# Patient Record
Sex: Male | Born: 2000 | Race: White | Hispanic: No | Marital: Single | Smoking: Never smoker
Health system: Southern US, Community
[De-identification: ages and names within clinical notes are randomized; demographics above are authoritative.]

## PROBLEM LIST (undated history)

## (undated) DIAGNOSIS — E785 Hyperlipidemia, unspecified: Secondary | ICD-10-CM

## (undated) HISTORY — PX: WISDOM TOOTH EXTRACTION: SHX21

## (undated) HISTORY — DX: Hyperlipidemia, unspecified: E78.5

---
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
## 2022-05-12 ENCOUNTER — Other Ambulatory Visit
Admission: RE | Admit: 2022-05-12 | Discharge: 2022-05-12 | Disposition: A | Discharge status: Home / Self Care | Payer: BC Managed Care – PPO | Source: Ambulatory Visit | Attending: Pediatrics | Admitting: Pediatrics

## 2022-05-12 DIAGNOSIS — Z13220 Encounter for screening for lipoid disorders: Secondary | ICD-10-CM | POA: Insufficient documentation

## 2022-05-12 DIAGNOSIS — Z113 Encounter for screening for infections with a predominantly sexual mode of transmission: Secondary | ICD-10-CM | POA: Diagnosis not present

## 2022-05-12 DIAGNOSIS — E559 Vitamin D deficiency, unspecified: Secondary | ICD-10-CM | POA: Insufficient documentation

## 2022-05-12 DIAGNOSIS — Z713 Dietary counseling and surveillance: Secondary | ICD-10-CM | POA: Insufficient documentation

## 2022-05-12 LAB — LIPID PANEL
Cholesterol: 365 mg/dL — ABNORMAL HIGH (ref 0–200)
HDL: 61 mg/dL (ref >40)
LDL Cholesterol: 292 mg/dL — ABNORMAL HIGH (ref 0–99)
Total CHOL/HDL Ratio: 6 RATIO
Triglycerides: 62 mg/dL (ref <150)
VLDL: 12 mg/dL (ref 0–40)

## 2022-05-12 LAB — COMPREHENSIVE METABOLIC PANEL
ALT: 28 U/L (ref 0–44)
AST: 23 U/L (ref 15–41)
Albumin: 5.3 g/dL — ABNORMAL HIGH (ref 3.5–5.0)
Alkaline Phosphatase: 63 U/L (ref 38–126)
Anion gap: 8 (ref 5–15)
BUN: 16 mg/dL (ref 6–20)
CO2: 25 mmol/L (ref 22–32)
Calcium: 10.2 mg/dL (ref 8.9–10.3)
Chloride: 106 mmol/L (ref 98–111)
Creatinine, Ser: 0.92 mg/dL (ref 0.61–1.24)
GFR, Estimated: >60 mL/min (ref >60)
Glucose, Bld: 88 mg/dL (ref 70–99)
Potassium: 3.7 mmol/L (ref 3.5–5.1)
Sodium: 139 mmol/L (ref 135–145)
Total Bilirubin: 3.6 mg/dL — ABNORMAL HIGH (ref 0.3–1.2)
Total Protein: 8.6 g/dL — ABNORMAL HIGH (ref 6.5–8.1)

## 2022-05-12 LAB — HIV ANTIBODY (ROUTINE TESTING W REFLEX): HIV Screen 4th Generation wRfx: NONREACTIVE

## 2022-05-12 LAB — VITAMIN D 25 HYDROXY (VIT D DEFICIENCY, FRACTURES): Vit D, 25-Hydroxy: 29.91 ng/mL — ABNORMAL LOW (ref 30–100)

## 2022-05-13 LAB — RPR: RPR Ser Ql: NONREACTIVE

## 2023-01-15 ENCOUNTER — Encounter: Payer: Self-pay | Admitting: Emergency Medicine

## 2023-01-15 ENCOUNTER — Ambulatory Visit
Admission: EM | Admit: 2023-01-15 | Discharge: 2023-01-15 | Disposition: A | Discharge status: Home / Self Care | Payer: BC Managed Care – PPO | Attending: Physician Assistant | Admitting: Physician Assistant

## 2023-01-15 DIAGNOSIS — J029 Acute pharyngitis, unspecified: Secondary | ICD-10-CM

## 2023-01-15 DIAGNOSIS — J019 Acute sinusitis, unspecified: Secondary | ICD-10-CM | POA: Diagnosis not present

## 2023-01-15 DIAGNOSIS — J039 Acute tonsillitis, unspecified: Secondary | ICD-10-CM | POA: Diagnosis not present

## 2023-01-15 MED ORDER — IPRATROPIUM BROMIDE 0.06 % NA SOLN: 2.0000 | Freq: Four times a day (QID) | NASAL | 0 refills | Status: DC

## 2023-01-15 MED ORDER — AMOXICILLIN-POT CLAVULANATE 875-125 MG PO TABS: 1.0000 | ORAL_TABLET | Freq: Two times a day (BID) | ORAL | 0 refills | Status: AC

## 2023-01-15 NOTE — ED Provider Notes (Incomplete)
[facility]    CSN: [id number] Arrival date & time: 01/15/23  1929      History   Chief Complaint No chief complaint on file.   HPI Scott Beasley is a 22 y.o. male.   HPI  No past medical history on file.  There are no problems to display for this patient.   *** The histories are not reviewed yet. Please review them in the "History" navigator section and refresh this SmartLink.     Home Medications    Prior to Admission medications   Not on File    Family History No family history on file.  Social History     Allergies   Patient has no allergy information on record.   Review of Systems Review of Systems  Constitutional:  Negative for fatigue and fever.  HENT:  Positive for congestion, postnasal drip and rhinorrhea. Negative for sinus pressure, sinus pain and sore throat.   Respiratory:  Negative for cough and shortness of breath.   Gastrointestinal:  Negative for abdominal pain, diarrhea, nausea and vomiting.  Musculoskeletal:  Positive for myalgias.  Neurological:  Negative for weakness, light-headedness and headaches.  Hematological:  Negative for adenopathy.     Physical Exam Triage Vital Signs ED Triage Vitals  Enc Vitals Group     BP      Pulse      Resp      Temp      Temp src      SpO2      Weight      Height      Head Circumference      Peak Flow      Pain Score      Pain Loc      Pain Edu?      Excl. in GC?    No data found.  Updated Vital Signs There were no vitals taken for this visit.   Physical Exam Vitals and nursing note reviewed.  Constitutional:      General: He is not in acute distress.    Appearance: Normal appearance. He is well-developed. He is not ill-appearing.  HENT:     Head: Normocephalic and atraumatic.     Nose: Congestion present.     Mouth/Throat:     Mouth: Mucous membranes are moist.     Pharynx: Oropharynx is clear. Posterior oropharyngeal erythema present.  Eyes:     General:  No scleral icterus.    Conjunctiva/sclera: Conjunctivae normal.  Cardiovascular:     Rate and Rhythm: Normal rate and regular rhythm.     Heart sounds: Normal heart sounds.  Pulmonary:     Effort: Pulmonary effort is normal. No respiratory distress.     Breath sounds: Normal breath sounds.  Musculoskeletal:     Cervical back: Neck supple.  Skin:    General: Skin is warm and dry.     Capillary Refill: Capillary refill takes less than 2 seconds.  Neurological:     General: No focal deficit present.     Mental Status: He is alert. Mental status is at baseline.     Motor: No weakness.     Gait: Gait normal.  Psychiatric:        Mood and Affect: Mood normal.        Behavior: Behavior normal.      UC Treatments / Results  Labs (all labs ordered are listed, but only abnormal results are displayed) Labs Reviewed - No data to display  EKG   Radiology No results found.  Procedures Procedures (including critical care time)  Medications Ordered in UC Medications - No data to display  Initial Impression / Assessment and Plan / UC Course  I have reviewed the triage vital signs and the nursing notes.  Pertinent labs & imaging results that were available during my care of the patient were reviewed by me and considered in my medical decision making (see chart for details).     *** Final Clinical Impressions(s) / UC Diagnoses   Final diagnoses:  None   Discharge Instructions   None    ED Prescriptions   None    PDMP not reviewed this encounter.

## 2023-01-15 NOTE — ED Triage Notes (Signed)
Patient reports allergy symptoms that started 2 weeks ago and has progressed to sore throat, fever and nasal congestion for a week.

## 2023-12-13 ENCOUNTER — Ambulatory Visit: Payer: Self-pay | Admitting: Nurse Practitioner

## 2023-12-13 ENCOUNTER — Encounter: Payer: Self-pay | Admitting: Nurse Practitioner

## 2023-12-13 VITALS — BP 100/64 | HR 52 | Temp 98.0°F | Ht 69.0 in | Wt 140.8 lb

## 2023-12-13 DIAGNOSIS — Z131 Encounter for screening for diabetes mellitus: Secondary | ICD-10-CM

## 2023-12-13 DIAGNOSIS — E785 Hyperlipidemia, unspecified: Secondary | ICD-10-CM | POA: Diagnosis not present

## 2023-12-13 DIAGNOSIS — Z0000 Encounter for general adult medical examination without abnormal findings: Secondary | ICD-10-CM | POA: Insufficient documentation

## 2023-12-13 DIAGNOSIS — Z1329 Encounter for screening for other suspected endocrine disorder: Secondary | ICD-10-CM

## 2023-12-13 NOTE — Assessment & Plan Note (Signed)
 He has elevated cholesterol with LDL of 292 in 2023 and a family history of hypercholesterolemia. Prefers lifestyle modifications. Discussed risks of high cholesterol and potential need for cardiology consultation if levels remain elevated. Order a fasting lipid panel. Advise dietary modifications to reduce cholesterol intake and encourage regular exercise. Consider omega-3 supplements. Reevaluate cholesterol levels after lab results and consider cardiology consultation if cholesterol remains elevated.

## 2023-12-13 NOTE — Progress Notes (Signed)
 Bethanie Dicker, NP-C Phone: [phone]  Scott Beasley is a 23 y.o. male who presents today to establish care.   Discussed the use of AI scribe software for clinical note transcription with the patient, who gave verbal consent to proceed.  History of Present Illness   The patient presents for an annual physical exam to monitor high cholesterol levels.   He has a family history of high cholesterol, as his mother also has this condition. He is not currently on any medications and has not been fasting today, so plans to return for fasting labs were discussed. Previous cholesterol levels were significantly elevated almost two years ago, but he has not been on any cholesterol medications due to his young age.  He has experienced difficulty gaining weight despite eating and exercising regularly, currently weighing 140 pounds. He also reports occasional night sweats, over the last several years but no significant fatigue, chills, or unexplained weight loss recently. Per chart review he weighed 127 pounds in 2020.   His diet is not particularly healthy, with a significant intake of sodas, sugars, and fried foods. He eats out and cooks at home equally. 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 He does not smoke tobacco but uses marijuana and consumes alcohol a couple of times a week. He has not visited a dentist recently and has not seen an eye doctor in two to three years.  No chest pain, shortness of breath, abdominal pain, constipation, diarrhea, painful urination, painful sex, headaches, dizziness, trouble swallowing, skin changes, rashes, cough, joint pains, leg swelling, mood problems, anxiety, or depression. He reports some recent sleep disturbances, specifically waking up during the night for about a month, but no significant life changes or stressors.      Active Ambulatory Problems    Diagnosis Date Noted   Preventative health care 12/13/2023    Hyperlipidemia 12/13/2023   Resolved Ambulatory Problems    Diagnosis Date Noted   No Resolved Ambulatory Problems   No Additional Past Medical History    Family History  Problem Relation Age of Onset   Hyperlipidemia Mother    Hyperlipidemia Maternal Uncle    Early death Maternal Uncle    Heart attack Maternal Uncle    Heart attack Paternal Grandfather     Social History   Socioeconomic History   Marital status: Single    Spouse name: Not on file   Number of children: Not on file   Years of education: Not on file   Highest education level: Not on file  Occupational History   Not on file  Tobacco Use   Smoking status: Every Day    Types: Cigarettes   Smokeless tobacco: Never  Vaping Use   Vaping status: Never Used  Substance and Sexual Activity   Alcohol use: Yes   Drug use: Never   Sexual activity: Yes  Other Topics Concern   Not on file  Social History Narrative   Not on file   Social Drivers of Health   Financial Resource Strain: Not on file  Food Insecurity: Not on file  Transportation Needs: Not on file  Physical Activity: Not on file  Stress: Not on file  Social Connections: Not on file  Intimate Partner Violence: Not on file    ROS  General:  Negative for unexplained weight loss, fever Skin: Negative for new or changing mole, sore that won't heal HEENT: Negative for trouble hearing, trouble seeing, ringing in ears, mouth sores, hoarseness,  change in voice, dysphagia. CV:  Negative for chest pain, dyspnea, edema, palpitations Resp: Negative for cough, dyspnea, hemoptysis GI: Negative for nausea, vomiting, diarrhea, constipation, abdominal pain, melena, hematochezia. GU: Negative for dysuria, incontinence, urinary hesitance, hematuria, vaginal or penile discharge, polyuria, sexual difficulty, lumps in testicle or breasts MSK: Negative for muscle cramps or aches, joint pain or swelling Neuro: Negative for headaches, weakness, numbness, dizziness,  passing out/fainting Psych: Negative for depression, anxiety, memory problems  Objective  Physical Exam Vitals:   12/13/23 1502  BP: 100/64  Pulse: (!) 52  Temp: 98 F (36.7 C)  SpO2: 98%    BP Readings from Last 3 Encounters:  12/13/23 100/64  01/15/23 117/73   Wt Readings from Last 3 Encounters:  12/13/23 140 lb 12.8 oz (63.9 kg)  01/15/23 140 lb (63.5 kg)    Physical Exam Constitutional:      General: He is not in acute distress.    Appearance: Normal appearance.  HENT:     Head: Normocephalic.     Right Ear: Tympanic membrane normal.     Left Ear: Tympanic membrane normal.     Nose: Nose normal.     Mouth/Throat:     Mouth: Mucous membranes are moist.     Pharynx: Oropharynx is clear.  Eyes:     Conjunctiva/sclera: Conjunctivae normal.     Pupils: Pupils are equal, round, and reactive to light.  Neck:     Thyroid: No thyromegaly.  Cardiovascular:     Rate and Rhythm: Normal rate and regular rhythm.     Heart sounds: Normal heart sounds.  Pulmonary:     Effort: Pulmonary effort is normal.     Breath sounds: Normal breath sounds.  Abdominal:     General: Abdomen is flat. Bowel sounds are normal.     Palpations: Abdomen is soft. There is no mass.     Tenderness: There is no abdominal tenderness.  Musculoskeletal:        General: Normal range of motion.  Lymphadenopathy:     Cervical: No cervical adenopathy.  Skin:    General: Skin is warm and dry.     Findings: No rash.  Neurological:     General: No focal deficit present.     Mental Status: He is alert.  Psychiatric:        Mood and Affect: Mood normal.        Behavior: Behavior normal.    Assessment/Plan:   Preventative health care Assessment & Plan: Physical exam complete. He experiences difficulty gaining weight despite a high-calorie diet and exercise. Potential causes include thyroid dysfunction and diabetes. Further investigation is warranted if symptoms such as unexplained weight loss,  chills, or fatigue develop. We will order routine lab work as outlined and contact patient with results. He will return to complete this when fasting. He is generally healthy but lacks recent dental or eye exams. Discussed potential health impacts of marijuana and alcohol use. Advise scheduling a dental and eye exam. Discussed potential genetic testing for familial hypercholesterolemia. Encourage healthy diet and regular exercise. Return to care in one year, sooner as needed.   Orders: -     CBC with Differential/Platelet; Future -     Comprehensive metabolic panel; Future  Hyperlipidemia, unspecified hyperlipidemia type Assessment & Plan: He has elevated cholesterol with LDL of 292 in 2023 and a family history of hypercholesterolemia. Prefers lifestyle modifications. Discussed risks of high cholesterol and potential need for cardiology consultation if levels remain elevated. Order a  fasting lipid panel. Advise dietary modifications to reduce cholesterol intake and encourage regular exercise. Consider omega-3 supplements. Reevaluate cholesterol levels after lab results and consider cardiology consultation if cholesterol remains elevated.  Orders: -     Lipid panel; Future  Thyroid disorder screen -     TSH; Future  Diabetes mellitus screening -     Hemoglobin A1c; Future    Return for fasting labs then in one year for annual exam, sooner as needed.   Bethanie Dicker, NP-C [facility]

## 2023-12-13 NOTE — Assessment & Plan Note (Signed)
 Physical exam complete. He experiences difficulty gaining weight despite a high-calorie diet and exercise. Potential causes include thyroid dysfunction and diabetes. Further investigation is warranted if symptoms such as unexplained weight loss, chills, or fatigue develop. We will order routine lab work as outlined and contact patient with results. He will return to complete this when fasting. He is generally healthy but lacks recent dental or eye exams. Discussed potential health impacts of marijuana and alcohol use. Advise scheduling a dental and eye exam. Discussed potential genetic testing for familial hypercholesterolemia. Encourage healthy diet and regular exercise. Return to care in one year, sooner as needed.

## 2023-12-20 ENCOUNTER — Other Ambulatory Visit

## 2023-12-27 ENCOUNTER — Other Ambulatory Visit (INDEPENDENT_AMBULATORY_CARE_PROVIDER_SITE_OTHER)

## 2023-12-27 DIAGNOSIS — E785 Hyperlipidemia, unspecified: Secondary | ICD-10-CM

## 2023-12-27 DIAGNOSIS — Z1329 Encounter for screening for other suspected endocrine disorder: Secondary | ICD-10-CM

## 2023-12-27 DIAGNOSIS — Z0000 Encounter for general adult medical examination without abnormal findings: Secondary | ICD-10-CM | POA: Diagnosis not present

## 2023-12-27 DIAGNOSIS — Z131 Encounter for screening for diabetes mellitus: Secondary | ICD-10-CM

## 2023-12-27 LAB — CBC WITH DIFFERENTIAL/PLATELET
Basophils Absolute: 0 10*3/uL (ref 0.0–0.1)
Basophils Relative: 0.5 % (ref 0.0–3.0)
Eosinophils Absolute: 0.1 10*3/uL (ref 0.0–0.7)
Eosinophils Relative: 1.6 % (ref 0.0–5.0)
HCT: 44 % (ref 39.0–52.0)
Hemoglobin: 15 g/dL (ref 13.0–17.0)
Lymphocytes Relative: 18.7 % (ref 12.0–46.0)
Lymphs Abs: 1.5 10*3/uL (ref 0.7–4.0)
MCHC: 34.1 g/dL (ref 30.0–36.0)
MCV: 97.5 fl (ref 78.0–100.0)
Monocytes Absolute: 0.4 10*3/uL (ref 0.1–1.0)
Monocytes Relative: 5.2 % (ref 3.0–12.0)
Neutro Abs: 5.9 10*3/uL (ref 1.4–7.7)
Neutrophils Relative %: 74 % (ref 43.0–77.0)
Platelets: 222 10*3/uL (ref 150.0–400.0)
RBC: 4.52 Mil/uL (ref 4.22–5.81)
RDW: 12.6 % (ref 11.5–15.5)
WBC: 8 10*3/uL (ref 4.0–10.5)

## 2023-12-27 LAB — COMPREHENSIVE METABOLIC PANEL
ALT: 19 U/L (ref 0–53)
AST: 17 U/L (ref 0–37)
Albumin: 4.9 g/dL (ref 3.5–5.2)
Alkaline Phosphatase: 59 U/L (ref 39–117)
BUN: 14 mg/dL (ref 6–23)
CO2: 25 meq/L (ref 19–32)
Calcium: 9.7 mg/dL (ref 8.4–10.5)
Chloride: 104 meq/L (ref 96–112)
Creatinine, Ser: 0.83 mg/dL (ref 0.40–1.50)
GFR: 125.11 mL/min (ref >60.00)
Glucose, Bld: 84 mg/dL (ref 70–99)
Potassium: 4.2 meq/L (ref 3.5–5.1)
Sodium: 138 meq/L (ref 135–145)
Total Bilirubin: 2.3 mg/dL — ABNORMAL HIGH (ref 0.2–1.2)
Total Protein: 7 g/dL (ref 6.0–8.3)

## 2023-12-27 LAB — HEMOGLOBIN A1C: Hgb A1c MFr Bld: 5.2 % (ref 4.6–6.5)

## 2023-12-27 LAB — LIPID PANEL
Cholesterol: 258 mg/dL — ABNORMAL HIGH (ref 0–200)
HDL: 61.6 mg/dL (ref >39.00)
LDL Cholesterol: 187 mg/dL — ABNORMAL HIGH (ref 0–99)
NonHDL: 196.12
Total CHOL/HDL Ratio: 4
Triglycerides: 46 mg/dL (ref 0.0–149.0)
VLDL: 9.2 mg/dL (ref 0.0–40.0)

## 2023-12-27 LAB — TSH: TSH: 0.54 u[IU]/mL (ref 0.35–5.50)

## 2023-12-28 ENCOUNTER — Telehealth: Payer: Self-pay

## 2023-12-28 NOTE — Telephone Encounter (Signed)
 Vm left to CB in regards to labs

## 2024-12-14 ENCOUNTER — Encounter: Admitting: Nurse Practitioner
